# Patient Record
Sex: Female | Born: 1995 | Hispanic: No | Marital: Single | Smoking: Former smoker
Health system: Southern US, Community
[De-identification: ages and names within clinical notes are randomized; demographics above are authoritative.]

## PROBLEM LIST (undated history)

## (undated) DIAGNOSIS — B009 Herpesviral infection, unspecified: Secondary | ICD-10-CM

## (undated) DIAGNOSIS — A749 Chlamydial infection, unspecified: Secondary | ICD-10-CM

## (undated) DIAGNOSIS — F419 Anxiety disorder, unspecified: Secondary | ICD-10-CM

## (undated) DIAGNOSIS — A630 Anogenital (venereal) warts: Secondary | ICD-10-CM

## (undated) DIAGNOSIS — B977 Papillomavirus as the cause of diseases classified elsewhere: Secondary | ICD-10-CM

## (undated) DIAGNOSIS — G540 Brachial plexus disorders: Secondary | ICD-10-CM

## (undated) HISTORY — PX: OTHER SURGICAL HISTORY: SHX169

## (undated) HISTORY — PX: BRACHIAL PLEXUS EXPLORATION: SHX1261

---

## 2015-08-17 ENCOUNTER — Encounter (HOSPITAL_COMMUNITY): Payer: Self-pay | Admitting: Emergency Medicine

## 2015-08-17 ENCOUNTER — Emergency Department (HOSPITAL_COMMUNITY)
Admission: EM | Admit: 2015-08-17 | Discharge: 2015-08-17 | Disposition: A | Discharge status: Home / Self Care | Payer: Non-veteran care | Attending: Emergency Medicine | Admitting: Emergency Medicine

## 2015-08-17 DIAGNOSIS — Z720 Tobacco use: Secondary | ICD-10-CM | POA: Insufficient documentation

## 2015-08-17 DIAGNOSIS — F419 Anxiety disorder, unspecified: Secondary | ICD-10-CM | POA: Insufficient documentation

## 2015-08-17 MED ORDER — LORAZEPAM 1 MG PO TABS: 1.0000 mg | ORAL_TABLET | Freq: Three times a day (TID) | ORAL | Status: DC | PRN

## 2015-08-17 NOTE — Discharge Instructions (Signed)
Panic Attacks °Panic attacks are sudden, short feelings of great fear or discomfort. You may have them for no reason when you are relaxed, when you are uneasy (anxious), or when you are sleeping.  °HOME CARE °· Take all your medicines as told. °· Check with your doctor before starting new medicines. °· Keep all doctor visits. °GET HELP IF: °· You are not able to take your medicines as told. °· Your symptoms do not get better. °· Your symptoms get worse. °GET HELP RIGHT AWAY IF: °· Your attacks seem different than your normal attacks. °· You have thoughts about hurting yourself or others. °· You take panic attack medicine and you have a side effect. °MAKE SURE YOU: °· Understand these instructions. °· Will watch your condition. °· Will get help right away if you are not doing well or get worse. °Document Released: 12/29/2010 Document Revised: 09/16/2013 Document Reviewed: 07/10/2013 °ExitCare® Patient Information ©2015 [employer]. This information is not intended to replace advice given to you by your health care provider. Make sure you discuss any questions you have with your health care provider. ° °

## 2015-08-17 NOTE — ED Provider Notes (Signed)
CSN: [id number]     Arrival date & time 08/17/15  1910 History  This chart was scribed for non-physician practitioner, Elpidio Anis, PA-C, working with Arby Barrette, MD, by Budd Palmer ED Scribe. This patient was seen in room WTR6/WTR6 and the patient's care was started at 9:19 PM     Chief Complaint  Patient presents with  . Anxiety   The history is provided by the patient. No language interpreter was used.   HPI Comments: Jody Harrington is a 19 y.o. female who presents to the Emergency Department complaining of anxiety onset 1 day ago. She reports associated panic attacks (2x) and depression. She states this is her second semester of junior year and she has been under a large amount of stress. She notes that she feels a little more calm and in control now. Pt states she has been to counseling which helped in the past, but has not seen anyone recently. She reports she was offered a script for medication to treat the anxiety, but did not want to take it due to depression being one of the side-effects. She notes smoking and drinking occasionally. Pt denies substance abuse. Pt denies SI, HI, hallucinations, n/v, fever, CP, and abdominal pain. Pt is allergic to sulfa antibiotics.  History reviewed. No pertinent past medical history. Past Surgical History  Procedure Laterality Date  . Brachial plexus exploration     No family history on file. Social History  Substance Use Topics  . Smoking status: Current Some Day Smoker  . Smokeless tobacco: None  . Alcohol Use: Yes   OB History    No data available     Review of Systems  Constitutional: Negative for fever.  Cardiovascular: Negative for chest pain.  Gastrointestinal: Negative for nausea, vomiting and abdominal pain.  Psychiatric/Behavioral: Negative for suicidal ideas and hallucinations.    Allergies  Sulfa antibiotics  Home Medications   Prior to Admission medications   Not on File   BP 137/85 mmHg  Pulse 77   Temp(Src) 98.3 F (36.8 C) (Oral)  Resp 19  Ht  (1.753 m)  Wt 162 lb (73.483 kg)  BMI 23.91 kg/m2  SpO2 98%  LMP 08/03/2015 Physical Exam  Constitutional: She appears well-developed and well-nourished.  HENT:  Head: Normocephalic and atraumatic.  Eyes: Conjunctivae are normal. Right eye exhibits no discharge. Left eye exhibits no discharge.  Pulmonary/Chest: Effort normal. No respiratory distress.  Neurological: She is alert. Coordination normal.  Skin: Skin is warm and dry. No rash noted. She is not diaphoretic. No erythema.  Psychiatric: She has a normal mood and affect.  Nursing note and vitals reviewed.   ED Course  Procedures  DIAGNOSTIC STUDIES: Oxygen Saturation is 98% on RA, normal by my interpretation.    COORDINATION OF CARE: 9:23 PM - Discussed plans to discharge. Advised pt to follow up with a counselor at school. Advised to return if symptoms worsen. Pt advised of plan for treatment and pt agrees.  Labs Review Labs Reviewed - No data to display  Imaging Review No results found. I have personally reviewed and evaluated these images and lab results as part of my medical decision-making.   EKG Interpretation None      MDM   Final diagnoses:  None    1. Anxiety  The patient is currently asymptomatic. She describes panic attacks, steady persistent anxiety. She has access to outpatient counseling and will pursue close follow up. She is not suicidal or homicidal.  She is not intoxicated.  Will Rx low # Ativan for prn use.   I personally performed the services described in this documentation, which was scribed in my presence. The recorded information has been reviewed and is accurate.     Elpidio Anis, PA-C 08/18/15 1610  Arby Barrette, MD 08/18/15 1800

## 2015-08-17 NOTE — ED Notes (Signed)
Pt states she has hx of anxiety but had panic attack yesterday and again today. Pt states she is not doing well in her classes, no family in the area. Pt does not take medication for this. Pt [facility].

## 2017-08-12 ENCOUNTER — Emergency Department (HOSPITAL_COMMUNITY)
Admission: EM | Admit: 2017-08-12 | Discharge: 2017-08-12 | Disposition: A | Discharge status: Home / Self Care | Payer: Non-veteran care | Attending: Emergency Medicine | Admitting: Emergency Medicine

## 2017-08-12 ENCOUNTER — Encounter (HOSPITAL_COMMUNITY): Payer: Self-pay | Admitting: Emergency Medicine

## 2017-08-12 DIAGNOSIS — Z79899 Other long term (current) drug therapy: Secondary | ICD-10-CM | POA: Diagnosis not present

## 2017-08-12 DIAGNOSIS — B372 Candidiasis of skin and nail: Secondary | ICD-10-CM | POA: Insufficient documentation

## 2017-08-12 DIAGNOSIS — R21 Rash and other nonspecific skin eruption: Secondary | ICD-10-CM | POA: Diagnosis present

## 2017-08-12 DIAGNOSIS — F17210 Nicotine dependence, cigarettes, uncomplicated: Secondary | ICD-10-CM | POA: Insufficient documentation

## 2017-08-12 MED ORDER — CLOTRIMAZOLE-BETAMETHASONE 1-0.05 % EX CREA: TOPICAL_CREAM | CUTANEOUS | 0 refills | Status: DC

## 2017-08-12 NOTE — Discharge Instructions (Signed)
You were evaluated in the emergency department for rash in between your finger spaces. This is a fungal infection. Please use antifungal cream as instructed  Practices aimed at minimizing moisture and friction in the involved area and reducing susceptibility to intertrigo are the mainstays of treatment. Typical beneficial practices include: Daily cleansing of skin with a mild cleanser followed by drying of affected area with a hair dryer on a cool setting Aeration of affected area when feasible Use of absorbent material or clothing, such as cotton, to separate skin in folds Avoid over sweating  Monitor for signs of infection including increased redness, swelling, pain, malodor, yellow discharge, fevers.  Contact [facility] to establish care with a primary care provider for regular, routine medical care.  This clinic accepts patients without medical insurance. A primary care provider can adjust your daily medications and give you refills.

## 2017-08-12 NOTE — ED Triage Notes (Signed)
Pt states that she has had a rash in between the fingers of her R hand for several months. States she is unable to move this hand because of an injury. Alert and oriented.

## 2017-08-12 NOTE — ED Provider Notes (Signed)
[facility]-EMERGENCY DEPT Provider Note   CSN: [id number] Arrival date & time: 08/12/17  1826     History   Chief Complaint Chief Complaint  Patient presents with  . Rash    HPI Jody Harrington is a 21 y.o. 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 This is the second time this rash has occurred in the last 2 months. She is unable to report pain or itching given history of chronic numbness. Denies injury. Has tried Aquaphor, peroxide/water soaks without benefit. No fevers, chills, malodor.  HPI  History reviewed. No pertinent past medical history.  There are no active problems to display for this patient.   Past Surgical History:  Procedure Laterality Date  . BRACHIAL PLEXUS EXPLORATION      OB History    No data available       Home Medications    Prior to Admission medications   Medication Sig Start Date End Date Taking? Authorizing Provider  clotrimazole-betamethasone (LOTRISONE) cream Apply to affected area 2 times daily prn 08/12/17   Liberty Handy, PA-C  LORazepam (ATIVAN) 1 MG tablet Take 1 tablet (1 mg total) by mouth 3 (three) times daily as needed for anxiety. 08/17/15   Elpidio Anis, PA-C    Family History History reviewed. No pertinent family history.  Social History Social History  Substance Use Topics  . Smoking status: Current Some Day Smoker  . Smokeless tobacco: Not on file  . Alcohol use Yes     Allergies   Sulfa antibiotics   Review of Systems Review of Systems  Constitutional: Negative for fever.  Skin: Positive for color change and rash.  Neurological: Positive for numbness (chronic).     Physical Exam Updated Vital Signs BP 122/70   Pulse 70   Temp 98.6 F (37 C) (Oral)   Resp 15   LMP 07/29/2017   SpO2 100%    Physical Exam  Constitutional: She is oriented to person, place, and time. She appears well-developed and well-nourished. No distress.  NAD.  HENT:  Head: Normocephalic and atraumatic.  Right Ear: External ear normal.  Left Ear: External ear normal.  Nose: Nose normal.  Eyes: Conjunctivae and EOM are normal. No scleral icterus.  Neck: Normal range of motion. Neck supple.  Cardiovascular: Normal rate, regular rhythm and normal heart sounds.   No murmur heard. Pulmonary/Chest: Effort normal and breath sounds normal. She has no wheezes.  Musculoskeletal: Normal range of motion. She exhibits no deformity.  Right hand is contracted and flaccid  Neurological: She is alert and oriented to person, place, and time.  Skin: Skin is warm and dry. Capillary refill takes less than 2 seconds. Rash noted.  Moist, erythematous, patches of skin within 2nd, 3rd and 4th finger web spaces with 2-3 satellite lesions  No surrounding fluctuance, warmth or streaking of erythema  Psychiatric: She has a normal mood and affect. Her behavior is normal. Judgment and thought content normal.  Nursing note and vitals reviewed.    ED Treatments / Results  Labs (all labs ordered are listed, but only abnormal results are displayed) Labs Reviewed - No data to display  EKG  EKG Interpretation None       Radiology No results found.  Procedures Procedures (including critical care time)  Medications Ordered in ED Medications - No data to display  Initial Impression / Assessment and Plan / ED Course  I have reviewed the triage vital signs and the nursing notes.  Pertinent labs & imaging results that were available during my care of the patient were reviewed by me and considered in my medical decision making (see chart for details).    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 She has been soaking hand in water/peroxide and applying for Neosporin without benefit. Unable to report itching, burning, pain to paralysis. On exam, her right hand is contracted and flaccid. She has moist, mildly erythematous plaques in between her web spaces with some satellite lesions. No evidence of abscess or cellulitis. Exam is consistent with intertrigo. Will discharge with antifungal cream and instructions to avoid moisture in between fingers. Patient verbalized understanding and is agreeable with plan. She is aware if symptoms return to ED. Final Clinical Impressions(s) / ED Diagnoses   Final diagnoses:  Candidal intertrigo    New Prescriptions Discharge Medication List as of 08/12/2017  9:54 PM    START taking these medications   Details  clotrimazole-betamethasone (LOTRISONE) cream Apply to affected area 2 times daily prn, Print         Liberty HandyGibbons, Jakobe Blau J, PA-C 08/12/17 2315    Rolan BuccoBelfi, Melanie, MD 08/15/17 [phone]

## 2017-09-19 ENCOUNTER — Ambulatory Visit (INDEPENDENT_AMBULATORY_CARE_PROVIDER_SITE_OTHER): Payer: Non-veteran care | Admitting: Student

## 2017-09-19 ENCOUNTER — Encounter: Payer: Self-pay | Admitting: Obstetrics and Gynecology

## 2017-09-19 ENCOUNTER — Encounter: Payer: Self-pay | Admitting: Student

## 2017-09-19 ENCOUNTER — Ambulatory Visit (INDEPENDENT_AMBULATORY_CARE_PROVIDER_SITE_OTHER): Payer: Self-pay | Admitting: Clinical

## 2017-09-19 VITALS — BP 132/84 | HR 101 | Ht 69.0 in | Wt 161.0 lb

## 2017-09-19 DIAGNOSIS — B009 Herpesviral infection, unspecified: Secondary | ICD-10-CM

## 2017-09-19 DIAGNOSIS — Z708 Other sex counseling: Secondary | ICD-10-CM

## 2017-09-19 DIAGNOSIS — F4323 Adjustment disorder with mixed anxiety and depressed mood: Secondary | ICD-10-CM

## 2017-09-19 MED ORDER — ACYCLOVIR 400 MG PO TABS: 400.0000 mg | ORAL_TABLET | Freq: Three times a day (TID) | ORAL | 3 refills | Status: AC

## 2017-09-19 NOTE — Patient Instructions (Signed)
Genital Herpes Genital herpes is a common sexually transmitted infection (STI) that is caused by a virus. The virus spreads from person to person through sexual contact. Infection can cause itching, blisters, and sores around the genitals or rectum. Symptoms may last several days and then go away This is called an outbreak. However, the virus remains in your body, so you may have more outbreaks in the future. The time between outbreaks varies and can be months or years. Genital herpes affects men and women. It is particularly concerning for pregnant women because the virus can be passed to the baby during delivery and can cause serious problems. Genital herpes is also a concern for people who have a weak disease-fighting (immune) system. What are the causes? This condition is caused by the herpes simplex virus (HSV) type 1 or type 2. The virus may spread through:  Sexual contact with an infected person, including vaginal, anal, and oral sex.  Contact with fluid from a herpes sore.  The skin. This means that you can get herpes from an infected partner even if he or she does not have a visible sore or does not know that he or she is infected. What increases the risk? You are more likely to develop this condition if:  You have sex with many partners.  You do not use latex condoms during sex. What are the signs or symptoms? Most people do not have symptoms (asymptomatic) or have mild symptoms that may be mistaken for other skin problems. Symptoms may include:  Small red bumps near the genitals, rectum, or mouth. These bumps turn into blisters and then turn into sores.  Flu-like symptoms, including:  Fever.  Body aches.  Swollen lymph nodes.  Headache.  Painful urination.  Pain and itching in the genital area or rectal area.  Vaginal discharge.  Tingling or shooting pain in the legs and buttocks. Generally, symptoms are more severe and last longer during the first (primary)  outbreak. Flu-like symptoms are also more common during the primary outbreak. How is this diagnosed? Genital herpes may be diagnosed based on:  A physical exam.  Your medical history.  Blood tests.  A test of a fluid sample (culture) from an open sore. How is this treated? There is no cure for this condition, but treatment with antiviral medicines that are taken by mouth (orally) can do the following:  Speed up healing and relieve symptoms.  Help to reduce the spread of the virus to sexual partners.  Limit the chance of future outbreaks, or make future outbreaks shorter.  Lessen symptoms of future outbreaks. Your health care provider may also recommend pain relief medicines, such as aspirin or ibuprofen. Follow these instructions at home: Sexual activity   Do not have sexual contact during active outbreaks.  Practice safe sex. Latex condoms and female condoms may help prevent the spread of the herpes virus. General instructions   Keep the affected areas dry and clean.  Take over-the-counter and prescription medicines only as told by your health care provider.  Avoid rubbing or touching blisters and sores. If you do touch blisters or sores:  Wash your hands thoroughly with soap and water.  Do not touch your eyes afterward.  To help relieve pain or itching, you may take the following actions as directed by your health care provider:  Apply a cold, wet cloth (cold compress) to affected areas 4-6 times a day.  Apply a substance that protects your skin and reduces bleeding (astringent).  Apply a   gel that helps relieve pain around sores (lidocaine gel).  Take a warm, shallow bath that cleans the genital area (sitz bath).  Keep all follow-up visits as told by your health care provider. This is important. How is this prevented?  Use condoms. Although anyone can get genital herpes during sexual contact, even with the use of a condom, a condom can provide some  protection.  Avoid having multiple sexual partners.  Talk with your sexual partner about any symptoms either of you may have. Also, talk with your partner about any history of STIs.  Get tested for STIs before you have sex. Ask your partner to do the same.  Do not have sexual contact if you have symptoms of genital herpes. Contact a health care provider if:  Your symptoms are not improving with medicine.  Your symptoms return.  You have new symptoms.  You have a fever.  You have abdominal pain.  You have redness, swelling, or pain in your eye.  You notice new sores on other parts of your body.  You are a woman and experience bleeding between menstrual periods.  You have had herpes and you become pregnant or plan to become pregnant. Summary  Genital herpes is a common sexually transmitted infection (STI) that is caused by the herpes simplex virus (HSV) type 1 or type 2.  These viruses are most often spread through sexual contact with an infected person.  You are more likely to develop this condition if you have sex with many partners or you have unprotected sex.  Most people do not have symptoms (asymptomatic) or have mild symptoms that may be mistaken for other skin problems. Symptoms occur as outbreaks that may happen months or years apart.  There is no cure for this condition, but treatment with oral antiviral medicines can reduce symptoms, reduce the chance of spreading the virus to a partner, prevent future outbreaks, or shorten future outbreaks. This information is not intended to replace advice given to you by your health care provider. Make sure you discuss any questions you have with your health care provider. Document Released: 11/23/2000 Document Revised: 10/26/2016 Document Reviewed: 10/26/2016 Elsevier Interactive Patient Education  2017 Elsevier Inc.  

## 2017-09-19 NOTE — BH Specialist Note (Signed)
Integrated Behavioral Health Initial Visit  MRN: [id number] Name: Jody Harrington  Number of Integrated Behavioral Health Clinician visits:: 1/6 Session Start time: 4:30  Session End time: 4:50 Total time: 20 minutes  Type of Service: Integrated Behavioral Health- Individual/Family Interpretor:No. Interpretor Name and Language: n/a   Warm Hand Off Completed.       SUBJECTIVE: Jody Harrington is a 21 y.o. female accompanied by n/a Patient was referred by Luna Kitchens, CNM for anxiety and depression. Patient reports the following symptoms/concerns: Pt states her primary concern is feeling stress over new diagnosis, as well as history of anxiety and panic attacks in the past; pt prefers natural remedies to cope with increasing anxiety, and does not want BH meds.  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 Reduce symptoms of: anxiety 2. Increase knowledge and/or ability of: self-management skills  3. Demonstrate ability to: Increase healthy adjustment to current life circumstances  INTERVENTIONS: Interventions utilized: Solution-Focused Strategies, Mindfulness or Management consultant and Psychoeducation and/or Health Education  Standardized Assessments completed: GAD-7 and PHQ 9  ASSESSMENT: Patient currently experiencing Adjustment disorder with mixed anxious and depressed mood.   Patient may benefit from psychoeducation and brief therapeutic interventions regarding coping with anxiety and  depression.  PLAN: 1. Follow up with behavioral health clinician on : As needed 2. Behavioral recommendations:  -Continue using aromatherapy and calming sounds daily, for as long as remains helpful -Consider CALM relaxation breathing exercise and/or additional calming apps as self-coping strategies -Read educational material regarding coping with symptoms of anxiety with panic attacks and anxiety 3. Referral(s): Integrated [facility] (In Clinic) 4. "From scale of 1-10, how likely are you to follow plan?": 8  Zhaire Locker C Korene Dula, LCSWA

## 2017-09-19 NOTE — BH Specialist Note (Signed)
error 

## 2017-09-19 NOTE — Progress Notes (Signed)
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 Patient very anxious regarding diagnosis and met with behavioral health.   Feliz Beam, M.D. Attending [place], [facility] for [employer], [place]

## 2017-09-20 ENCOUNTER — Encounter: Payer: Non-veteran care | Admitting: Obstetrics & Gynecology

## 2017-09-21 DIAGNOSIS — Z708 Other sex counseling: Secondary | ICD-10-CM | POA: Insufficient documentation

## 2017-09-21 NOTE — Progress Notes (Signed)
Subjective:     Patient ID: Jody Harrington, female   DOB: 1996/05/08, 21 y.o.   MRN: [id number]  HPI Patient Jody Harrington is a 21 y.o. non-pregnant female here with concerns about HSV. She and her friend both recently had positive HSV serology at an outpatient lab (patient ordered and paid for the labs herself). She is here because she is concerned and confused about herpes. She has a new partner, and is sexually active.  Patient was recently treated for chlamydia. She denies dysuria, vaginal discharge, pain with intercourse, or abnormal discharge or vaginal pain.   Patient is extremely anxious and tearful about her diagnosis. She has many questions about whether or not this is a false positive, if she can ever have children, if she can enroll in a clinical trial.  Review of Systems  HENT: Negative.   Respiratory: Negative.   Gastrointestinal: Negative.   Genitourinary: Negative.   Musculoskeletal: Negative.   Neurological: Negative.   Psychiatric/Behavioral: Negative.        Objective:   Physical Exam  Constitutional: She is oriented to person, place, and time. She appears well-developed.  Neck: Normal range of motion.  Cardiovascular: Normal rate.   Pulmonary/Chest: Effort normal.  Abdominal: Soft.  Genitourinary: Vagina normal. No vaginal discharge found.  Genitourinary Comments: NEFG; no lesions or ulcerations. No CMT, adnexal tenderness or suprapubic tenderness.   Musculoskeletal: Normal range of motion.  Neurological: She is alert and oriented to person, place, and time.  Skin: Skin is warm and dry.  Psychiatric: She has a normal mood and affect.       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 Sexually transmitted disease counseling    Well woman exam; no signs of active HSV infection. High level of anxiety over recent diagnosis.     Plan:    1. Rx for acyclovir given, with instructions for how to treat primary outbreak and secondary  outbreak.  2. Extensive counseling about HSV transmission in person; patient also spoke with Hulda Marin at [facility].  3. Encouraged patient to monitor herself for symptoms and take medication if she feels an outbreak. She should also make an appt to see [facility] as soon as she has an outbreak so we can assess the severity and provide guidance and reassurance.  4. Patient verbalized understanding.  Luna Kitchens

## 2018-03-05 ENCOUNTER — Encounter (HOSPITAL_COMMUNITY): Payer: Self-pay

## 2018-03-05 ENCOUNTER — Other Ambulatory Visit: Payer: Self-pay

## 2018-03-05 ENCOUNTER — Emergency Department (HOSPITAL_COMMUNITY)
Admission: EM | Admit: 2018-03-05 | Discharge: 2018-03-05 | Disposition: A | Discharge status: Home / Self Care | Payer: Self-pay | Attending: Emergency Medicine | Admitting: Emergency Medicine

## 2018-03-05 DIAGNOSIS — Z87891 Personal history of nicotine dependence: Secondary | ICD-10-CM | POA: Insufficient documentation

## 2018-03-05 DIAGNOSIS — R1030 Lower abdominal pain, unspecified: Secondary | ICD-10-CM | POA: Insufficient documentation

## 2018-03-05 DIAGNOSIS — N939 Abnormal uterine and vaginal bleeding, unspecified: Secondary | ICD-10-CM | POA: Insufficient documentation

## 2018-03-05 LAB — URINALYSIS, ROUTINE W REFLEX MICROSCOPIC
BILIRUBIN URINE: NEGATIVE
Glucose, UA: NEGATIVE mg/dL
Ketones, ur: NEGATIVE mg/dL
Nitrite: NEGATIVE
PH: 6 (ref 5.0–8.0)
Protein, ur: NEGATIVE mg/dL
SPECIFIC GRAVITY, URINE: 1.008 (ref 1.005–1.030)

## 2018-03-05 LAB — CBC
HCT: 41.6 % (ref 36.0–46.0)
Hemoglobin: 13.6 g/dL (ref 12.0–15.0)
MCH: 28.5 pg (ref 26.0–34.0)
MCHC: 32.7 g/dL (ref 30.0–36.0)
MCV: 87.2 fL (ref 78.0–100.0)
PLATELETS: 289 10*3/uL (ref 150–400)
RBC: 4.77 MIL/uL (ref 3.87–5.11)
RDW: 15.6 % — AB (ref 11.5–15.5)
WBC: 6.1 10*3/uL (ref 4.0–10.5)

## 2018-03-05 LAB — COMPREHENSIVE METABOLIC PANEL
ALK PHOS: 54 U/L (ref 38–126)
ALT: 14 U/L (ref 14–54)
AST: 19 U/L (ref 15–41)
Albumin: 4.4 g/dL (ref 3.5–5.0)
Anion gap: 11 (ref 5–15)
BILIRUBIN TOTAL: 0.8 mg/dL (ref 0.3–1.2)
BUN: 8 mg/dL (ref 6–20)
CALCIUM: 9.1 mg/dL (ref 8.9–10.3)
CO2: 23 mmol/L (ref 22–32)
CREATININE: 0.73 mg/dL (ref 0.44–1.00)
Chloride: 105 mmol/L (ref 101–111)
GFR calc Af Amer: >60 mL/min (ref >60)
Glucose, Bld: 93 mg/dL (ref 65–99)
Potassium: 3.7 mmol/L (ref 3.5–5.1)
Sodium: 139 mmol/L (ref 135–145)
Total Protein: 7.4 g/dL (ref 6.5–8.1)

## 2018-03-05 LAB — LIPASE, BLOOD: Lipase: 25 U/L (ref 11–51)

## 2018-03-05 LAB — I-STAT TROPONIN, ED: Troponin i, poc: 0 ng/mL (ref 0.00–0.08)

## 2018-03-05 LAB — PREGNANCY, URINE: PREG TEST UR: NEGATIVE

## 2018-03-05 NOTE — ED Notes (Signed)
Pt has been outside talking on phone and just came inside and asked if her name has been called. Informed Pt that I would let triage know that she was here and asked her to please have a seat and stay inside so that she can be seen. Clydie BraunKaren RN made aware pt is in lobby.

## 2018-03-05 NOTE — Discharge Instructions (Addendum)
Your work up today has been reassuring. You're having a menstrual cycle that came early, which can sometimes happen. Alternate between tylenol and motrin as needed for pain, use heating pad over your belly to help with pain. Stay well hydrated. Follow up with the women's outpatient clinic in 1 week for recheck of symptoms and to establish OBGYN care. You can also follow up with the [facility] center in 1-2 weeks to establish general medical care and follow up after today's visit. [facility] the [facility] (similar to their version of an ER) as needed for emergent changes or worsening symptoms.

## 2018-03-05 NOTE — ED Triage Notes (Signed)
Pt reports vaginal bleeding X5 days. She reports it initially began as spotting but is now very heavy. LMP: 02/15/18. Pt concerned she could have had a miscarriage. Pt also reports some cramping below the navel.

## 2018-03-05 NOTE — ED Provider Notes (Signed)
[facility]   CSN: [id number] Arrival date & time: 03/05/18  1607     History   Chief Complaint Chief Complaint  Patient presents with  . Vaginal Bleeding    HPI Jody Harrington is a 22 y.o. female with a PMHx of HSV2, who presents to the ED with complaints of vaginal bleeding x 5 days.  Patient states that she started having bleeding initially just spotting but then became heavier like a menstrual cycle, going through about 2 pads per day with dark red blood and very small clots being passed.  She also reports associated 2/10 constant cramping nonradiating suprapubic abdominal pain with no known aggravating or alleviating factors, no treatments have been tried.  Lastly she mentions having some nausea occasionally with the pain.  She [facility] the [facility] 2 days ago and had STD testing done, but she has not gotten the results yet.  LMP was on 02/15/18 lasting about 4-5 days, and she states that her menses are usually very regular.  She is sexually active with 164 female partners in the last year, occasionally unprotected.  She denies fevers, chills, CP, SOB, V/D/C, hematuria, dysuria, vaginal discharge, genital sores, myalgias, arthralgias, lightheadedness, numbness, tingling, focal weakness, or any other complaints at this time.   The history is provided by the patient and medical records. No language interpreter was used.  Vaginal Bleeding  Primary symptoms include vaginal bleeding.  Primary symptoms include no discharge, no genital lesions, no dysuria. There has been no fever. This is a new problem. The current episode started more than 2 days ago. The problem occurs constantly. The problem has not changed since onset.The symptoms occur spontaneously. She has not missed her period. Her LMP was weeks ago. The patient's menstrual history has been regular. Associated symptoms include abdominal pain and nausea. Pertinent negatives include no  constipation, no diarrhea, no vomiting and no light-headedness. She has tried nothing for the symptoms. The treatment provided no relief. Sexual activity: multiple partners and sexually active.    History reviewed. No pertinent past medical history.  Patient Active Problem List   Diagnosis Date Noted  . Sexually transmitted disease counseling 09/21/2017  . HSV-2 (herpes simplex virus 2) infection 09/19/2017    Past Surgical History:  Procedure Laterality Date  . BRACHIAL PLEXUS EXPLORATION       OB History   None      Home Medications    Prior to Admission medications   Medication Sig Start Date End Date Taking? Authorizing Provider  clotrimazole-betamethasone (LOTRISONE) cream Apply to affected area 2 times daily prn 08/12/17   Liberty HandyGibbons, Claudia J, PA-C  LORazepam (ATIVAN) 1 MG tablet Take 1 tablet (1 mg total) by mouth 3 (three) times daily as needed for anxiety. 08/17/15   Elpidio AnisUpstill, Shari, PA-C    Family History Family History  Problem Relation Age of Onset  . Diabetes Mother     Social History Social History   Tobacco Use  . Smoking status: Former Games developermoker  . Smokeless tobacco: Never Used  Substance Use Topics  . Alcohol use: Yes    Comment: Occasional  . Drug use: No     Allergies   Sulfa antibiotics   Review of Systems Review of Systems  Constitutional: Negative for chills and fever.  Respiratory: Negative for shortness of breath.   Cardiovascular: Negative for chest pain.  Gastrointestinal: Positive for abdominal pain and nausea. Negative for constipation, diarrhea and vomiting.  Genitourinary: Positive for  vaginal bleeding. Negative for dysuria, genital sores, hematuria and vaginal discharge.  Musculoskeletal: Negative for arthralgias and myalgias.  Skin: Negative for color change.  Allergic/Immunologic: Negative for immunocompromised state.  Neurological: Negative for weakness, light-headedness and numbness.  Psychiatric/Behavioral: Negative for  confusion.   All other systems reviewed and are negative for acute change except as noted in the HPI.    Physical Exam Updated Vital Signs BP 134/70 (BP Location: Right Arm)   Pulse 70   Temp 98.4 F (36.9 C) (Oral)   Resp 16   LMP 02/15/2018 (Within Days)   SpO2 99%   Physical Exam  Constitutional: She is oriented to person, place, and time. Vital signs are normal. She appears well-developed and well-nourished.  Non-toxic appearance. No distress.  Afebrile, nontoxic, NAD  HENT:  Head: Normocephalic and atraumatic.  Mouth/Throat: Oropharynx is clear and moist and mucous membranes are normal.  Eyes: Conjunctivae and EOM are normal. Right eye exhibits no discharge. Left eye exhibits no discharge.  Neck: Normal range of motion. Neck supple.  Cardiovascular: Normal rate, regular rhythm, normal heart sounds and intact distal pulses. Exam reveals no gallop and no friction rub.  No murmur heard. Pulmonary/Chest: Effort normal and breath sounds normal. No respiratory distress. She has no decreased breath sounds. She has no wheezes. She has no rhonchi. She has no rales.  Abdominal: Soft. Normal appearance and bowel sounds are normal. She exhibits no distension. There is tenderness in the right lower quadrant, suprapubic area and left lower quadrant. There is no rigidity, no rebound, no guarding, no CVA tenderness, no tenderness at McBurney's point and negative Murphy's sign.  Soft, nondistended, +BS throughout, with very mild lower abd/suprapubic TTP, no r/g/r, neg murphy's, neg mcburney's, no CVA TTP   Genitourinary: Uterus normal. Pelvic exam was performed with patient supine. There is no rash, tenderness or lesion on the right labia. There is no rash, tenderness or lesion on the left labia. Cervix exhibits no motion tenderness, no discharge and no friability. Right adnexum displays no mass, no tenderness and no fullness. Left adnexum displays no mass, no tenderness and no fullness. There is  bleeding in the vagina. No erythema or tenderness in the vagina. No vaginal discharge found.  Genitourinary Comments: Chaperone present for exam. No rashes, lesions, or tenderness to external genitalia. No erythema, injury, or tenderness to vaginal mucosa. No vaginal discharge within vaginal vault. Very scant dark menstrual-like blood in vaginal vault, coming from the cervix. No adnexal masses, tenderness, or fullness. No  CMT, cervical friability, or discharge from cervical os. Cervical os is closed. Uterus non-deviated, mobile, nonTTP, and without enlargement.    Musculoskeletal: Normal range of motion.  Neurological: She is alert and oriented to person, place, and time. She has normal strength. No sensory deficit.  Skin: Skin is warm, dry and intact. No rash noted.  Psychiatric: She has a normal mood and affect.  Nursing note and vitals reviewed.    ED Treatments / Results  Labs (all labs ordered are listed, but only abnormal results are displayed) Labs Reviewed  CBC - Abnormal; Notable for the following components:      Result Value   RDW 15.6 (*)    All other components within normal limits  URINALYSIS, ROUTINE W REFLEX MICROSCOPIC - Abnormal; Notable for the following components:   Hgb urine dipstick LARGE (*)    Leukocytes, UA TRACE (*)    Bacteria, UA RARE (*)    Squamous Epithelial / LPF 0-5 (*)  All other components within normal limits  WET PREP, GENITAL  LIPASE, BLOOD  COMPREHENSIVE METABOLIC PANEL  PREGNANCY, URINE  I-STAT TROPONIN, ED    EKG None  Radiology No results found.  Procedures Procedures (including critical care time)  Medications Ordered in ED Medications - No data to display   Initial Impression / Assessment and Plan / ED Course  I have reviewed the triage vital signs and the nursing notes.  Pertinent labs & imaging results that were available during my care of the patient were reviewed by me and considered in my medical decision making (see  chart for details).     22 y.o. female here with 5 days of vaginal bleeding similar to a menstrual cycle, and lower abd cramping and mild nausea. She had STD testing 2 days ago at the health dept, hasn't heard back yet on the results. On exam, mild suprapubic and lower abd TTP, nonperitoneal, no mcburney's point tenderness, no flank tenderness. Work up thus far reveals: lipase WNL, CMP WNL, CBC WNL, U/A without evidence of infection but has blood likely from vaginal contaminant; troponin done for unclear reasons, probably should have been a betaHCG which hasn't been done. Will have Upreg run now and perform pelvic exam and get wet prep. Will reassess shortly.   10:01 PM Pelvic reveals scant amount of menstrual blood in vaginal vault, no CMT, no discharge, no adnexal masses or tenderness. Awaiting Upreg to decide on next step. Will reassess shortly.   10:31 PM Upreg negative. Wet prep unable to be run due to lack of enough saline in the vial, however she has no evidence or symptoms of BV or yeast/trich. Doubt need for further emergent work up at this time, appears that she's just having AUB with an earlier than expected menstrual cycle. Advised tylenol/motrin/heat use, and f/up with OBGYN in 1wk for recheck and to establish care. Pt also requesting referral to a PCP, will give [facility] f/up information to establish PCP care. Strict return precautions advised. I explained the diagnosis and have given explicit precautions to return to the ER including for any other new or worsening symptoms. The patient understands and accepts the medical plan as it's been dictated and I have answered their questions. Discharge instructions concerning home care and prescriptions have been given. The patient is STABLE and is discharged to home in good condition.    Final Clinical Impressions(s) / ED Diagnoses   Final diagnoses:  Abnormal uterine bleeding (AUB)  Vaginal bleeding  Lower abdominal pain    ED Discharge  Orders    [address], [place], [place] 03/05/18 2231    Little, Ambrose Finland, MD 03/06/18 1505

## 2018-06-27 ENCOUNTER — Other Ambulatory Visit (HOSPITAL_COMMUNITY): Payer: Self-pay | Admitting: Nurse Practitioner

## 2018-06-27 DIAGNOSIS — Z369 Encounter for antenatal screening, unspecified: Secondary | ICD-10-CM

## 2018-06-30 ENCOUNTER — Encounter (HOSPITAL_COMMUNITY): Payer: Self-pay

## 2018-07-03 ENCOUNTER — Encounter (HOSPITAL_COMMUNITY): Payer: Self-pay | Admitting: *Deleted

## 2018-07-07 ENCOUNTER — Other Ambulatory Visit (HOSPITAL_COMMUNITY): Payer: Self-pay | Admitting: Nurse Practitioner

## 2018-07-07 ENCOUNTER — Ambulatory Visit (HOSPITAL_COMMUNITY): Admission: RE | Admit: 2018-07-07 | Payer: Medicaid Other | Source: Ambulatory Visit

## 2018-07-07 ENCOUNTER — Encounter (HOSPITAL_COMMUNITY): Payer: Self-pay

## 2018-07-07 ENCOUNTER — Ambulatory Visit (HOSPITAL_COMMUNITY)
Admission: RE | Admit: 2018-07-07 | Discharge: 2018-07-07 | Disposition: A | Discharge status: Home / Self Care | Payer: Medicaid Other | Source: Ambulatory Visit | Attending: Nurse Practitioner | Admitting: Nurse Practitioner

## 2018-07-07 DIAGNOSIS — F1290 Cannabis use, unspecified, uncomplicated: Secondary | ICD-10-CM | POA: Diagnosis not present

## 2018-07-07 DIAGNOSIS — O99333 Smoking (tobacco) complicating pregnancy, third trimester: Secondary | ICD-10-CM

## 2018-07-07 DIAGNOSIS — O99331 Smoking (tobacco) complicating pregnancy, first trimester: Secondary | ICD-10-CM | POA: Insufficient documentation

## 2018-07-07 DIAGNOSIS — Z3A13 13 weeks gestation of pregnancy: Secondary | ICD-10-CM | POA: Diagnosis not present

## 2018-07-07 DIAGNOSIS — Z369 Encounter for antenatal screening, unspecified: Secondary | ICD-10-CM

## 2018-07-07 DIAGNOSIS — Z3682 Encounter for antenatal screening for nuchal translucency: Secondary | ICD-10-CM | POA: Diagnosis present

## 2018-07-07 HISTORY — DX: Papillomavirus as the cause of diseases classified elsewhere: B97.7

## 2018-07-07 HISTORY — DX: Chlamydial infection, unspecified: A74.9

## 2018-07-07 HISTORY — DX: Brachial plexus disorders: G54.0

## 2018-07-07 HISTORY — DX: Anogenital (venereal) warts: A63.0

## 2018-07-07 HISTORY — DX: Anxiety disorder, unspecified: F41.9

## 2018-07-07 HISTORY — DX: Herpesviral infection, unspecified: B00.9

## 2018-07-07 IMAGING — US US MFM OB COMPLETE +14 WKS
1 series · 15 of 28 positions shown · non-contrast
Comparison: none

[Series 1: us mfm ob complete +14 wks · 41 acquisitions, 15 frames shown]
[im 1/41]
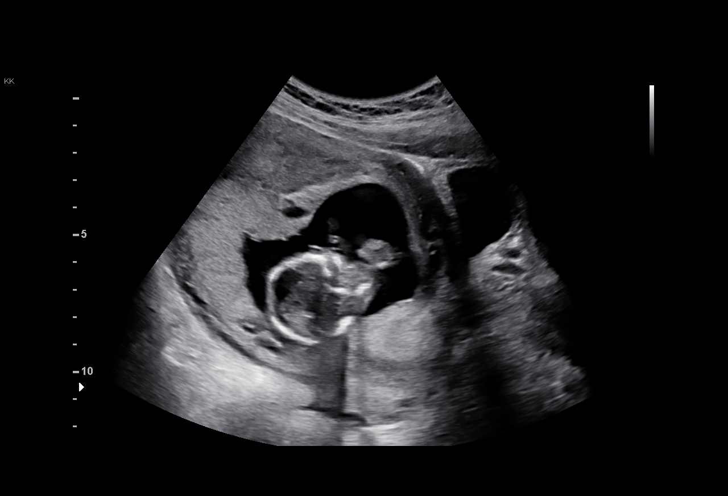
[im 3/41]
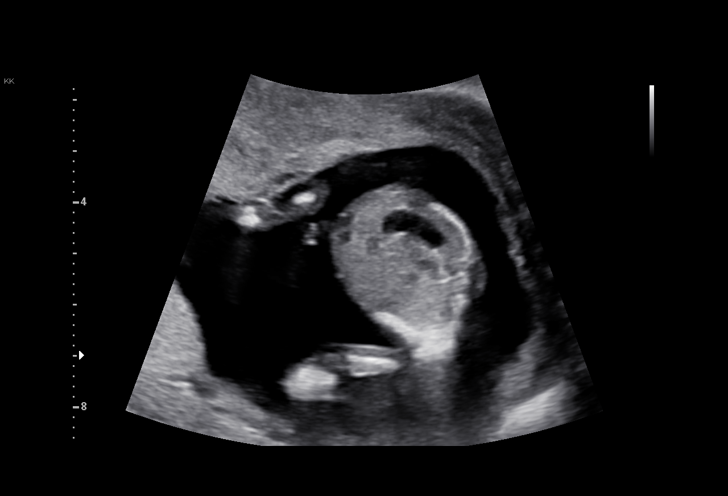
[im 6/41]
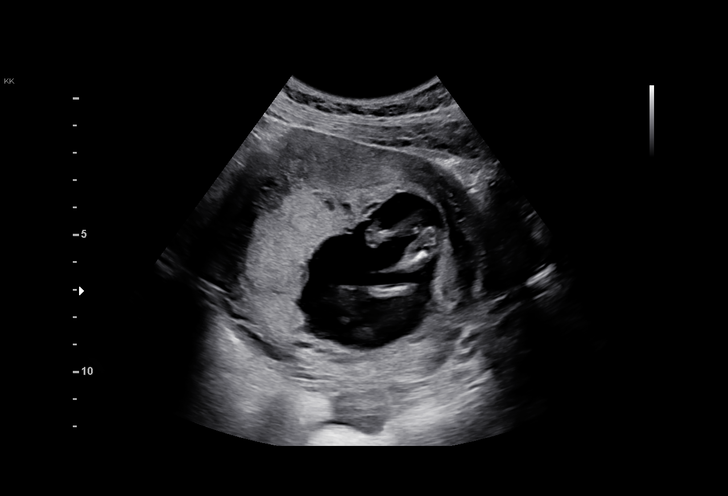
[im 9/41]
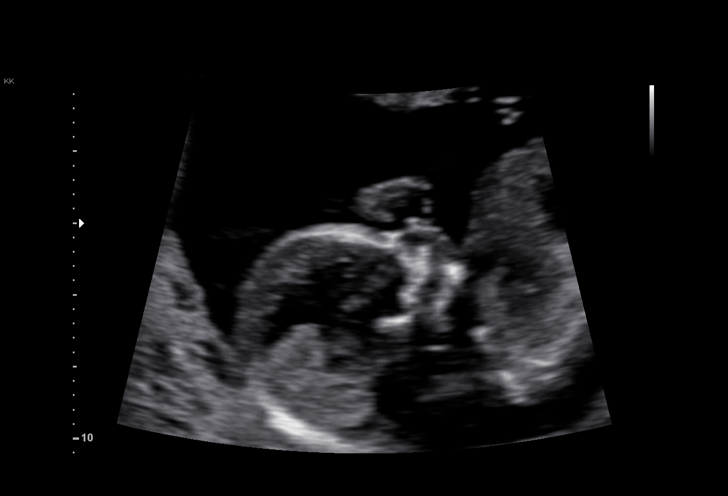
[im 12/41]
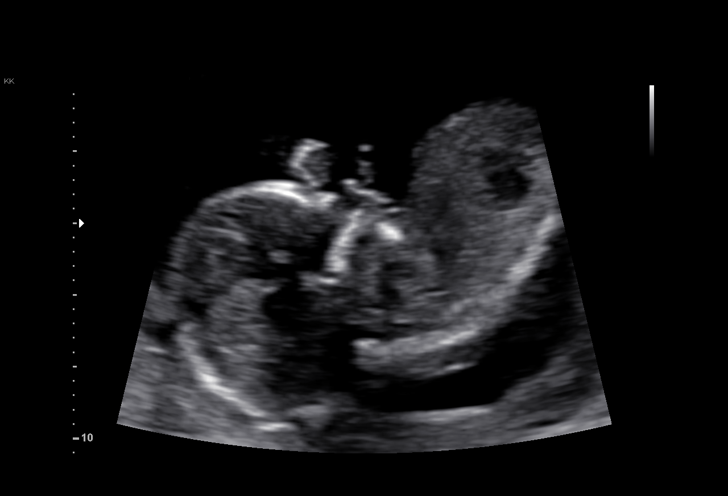
[im 15/41]
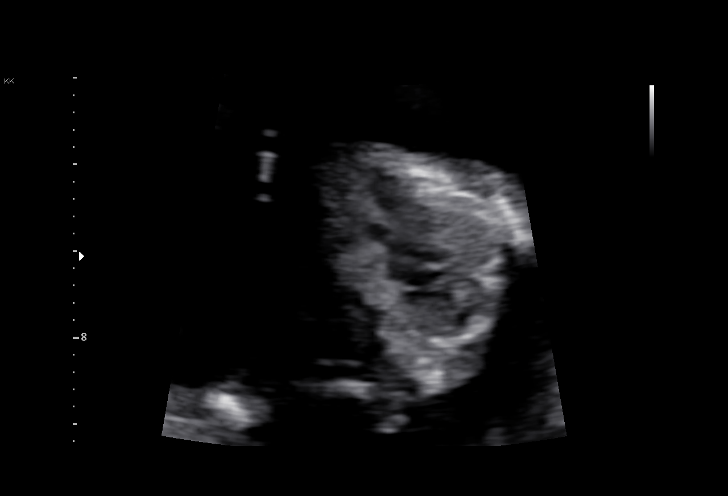
[im 18/41]
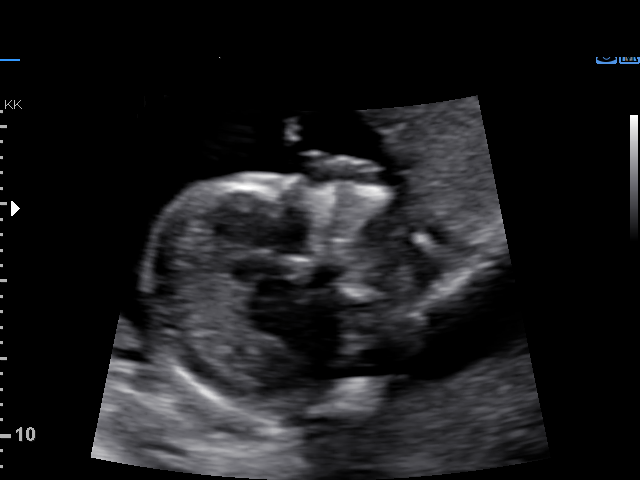
[im 21/41]
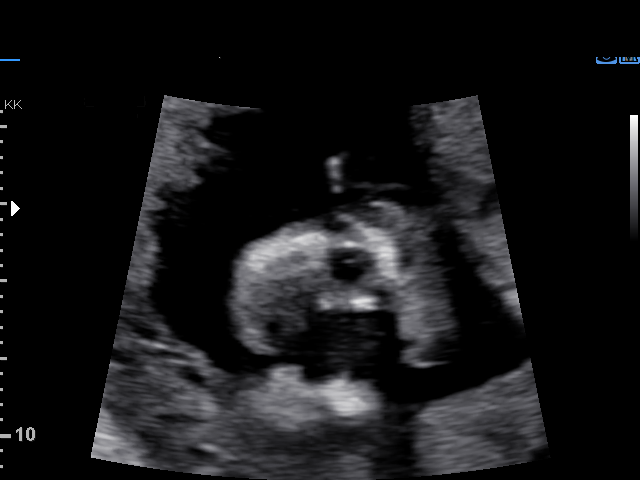
[im 23/41]
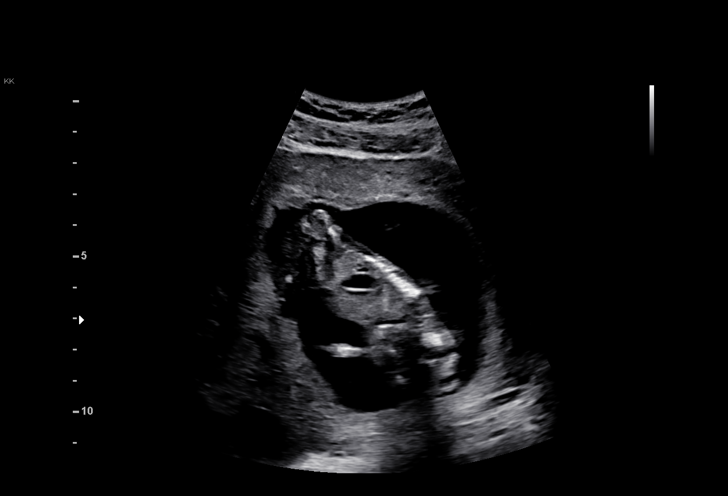
[im 26/41]
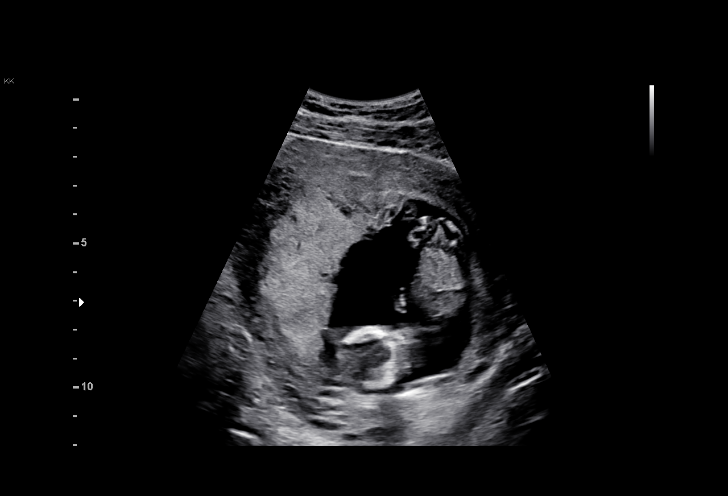
[im 29/41]
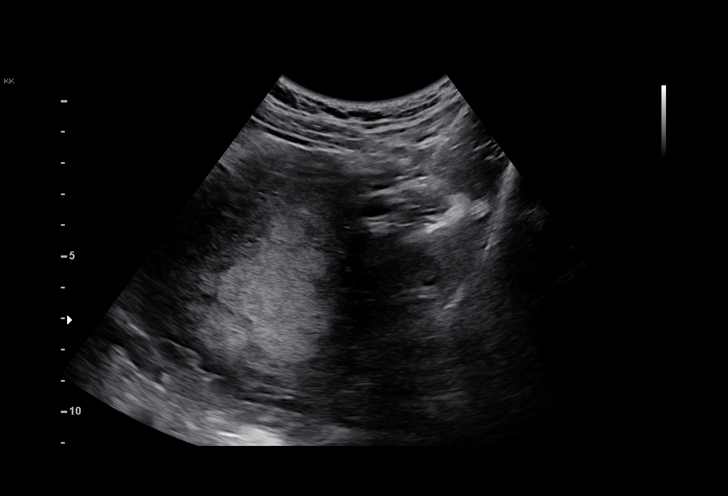
[im 32/41]
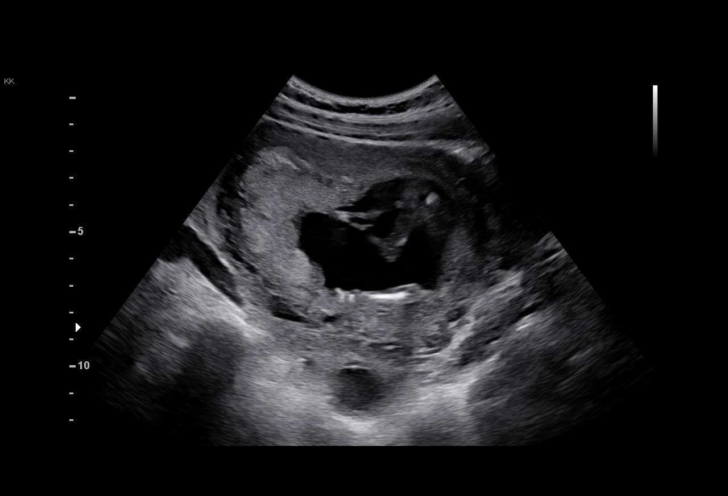
[im 35/41]
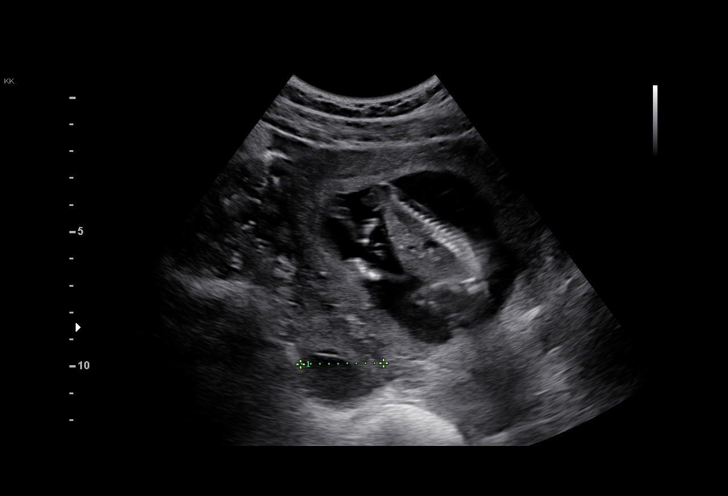
[im 38/41]
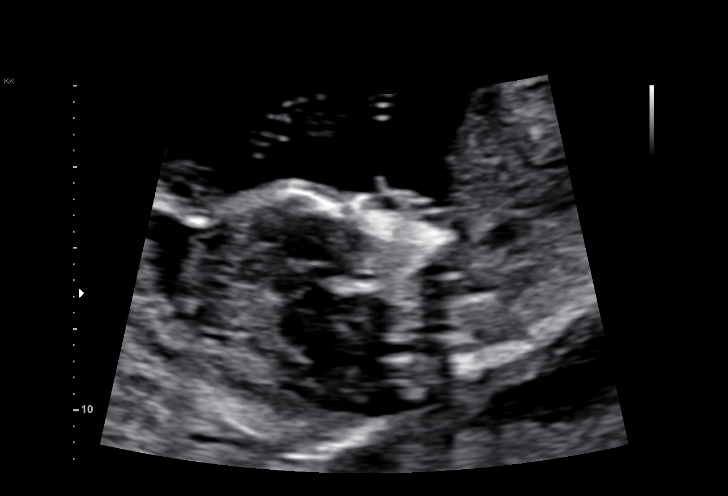
[im 41/41]
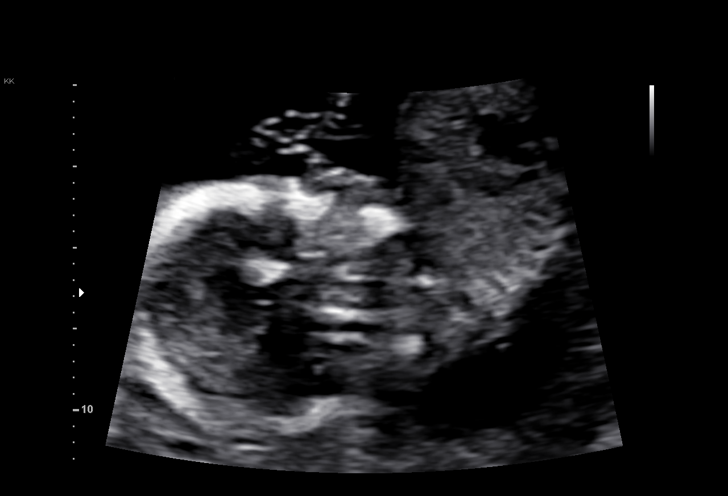

[15 of 28 positions shown; findings below may reference images not displayed]

[REDACTED]-
Faculty Physician

WEEKS

1  BUNCH                [PHONE_NUMBER]      [PHONE_NUMBER]     [PHONE_NUMBER]
BUNCH
Indications

13 weeks gestation of pregnancy
Encounter for nuchal translucency              [4Z]
Tobacco use complicating pregnancy             [4Z]
Marijuana Use
OB History

Blood Type:            Height:  5'9"   Weight (lb):  158       BMI:
Gravidity:    1
Fetal Evaluation

Num Of Fetuses:     1
Fetal Heart         155
Rate(bpm):
Cardiac Activity:   Observed
Presentation:       Variable

Amniotic Fluid
AFI FV:      Subjectively within normal limits
Biometry

CRL:      72.9  mm     G. Age:  13w 1d                  EDD:   [DATE]
Gestational Age

LMP:           13w 4d        Date:  [DATE]                 EDD:   [DATE]
Best:          13w 4d     Det. By:  LMP  ([DATE])          EDD:   [DATE]
Cervix Uterus Adnexa

Cervix
Normal appearance by transabdominal scan.
Impression

Patient is here for first-trimester screening. She gives history
of marijuana use.
She reports no chronic medical conditions.
On ultrasound, the CRL measurement is consistent with her
previously-established dates and good fetal heart activity is
seen. The nuchal translucency (NT) could not be assessed
because of fetal position.  Fetal anatomy that could be
ascertained at this gestational age is normal.

I explained the reason for not obtaining NT measurement and
reassured her that the fetus, otherwise, looks normal.
I discussed quad screening and cell-free fetal DNA
screening, their significance and limitations. Patient opted to
have quad screening. I also informed her that only invasive
testing will give a definitive result on the fetal karyotype.

that even though congenital malformations are not increased
with marijuana use, we do not recommend marijuana use in
pregnancy because the long-term effects are not known. The
fetal brain has THC receptors and marijuana crosses the
placenta.
Recommendations

-Quad screening between 15 and 20 weeks.
-Fetal anatomy scan at 20 weeks.

## 2019-04-02 ENCOUNTER — Encounter (HOSPITAL_COMMUNITY): Payer: Self-pay
